# Patient Record
Sex: Male | Born: 1955 | Race: White | Hispanic: No | Marital: Single | Smoking: Never smoker
Health system: Southern US, Community
[De-identification: ages and names within clinical notes are randomized; demographics above are authoritative.]

## PROBLEM LIST (undated history)

## (undated) DIAGNOSIS — I10 Essential (primary) hypertension: Secondary | ICD-10-CM

## (undated) HISTORY — PX: KNEE SURGERY: SHX244

## (undated) HISTORY — PX: FRACTURE SURGERY: SHX138

---

## 2017-11-13 ENCOUNTER — Ambulatory Visit (INDEPENDENT_AMBULATORY_CARE_PROVIDER_SITE_OTHER): Payer: BLUE CROSS/BLUE SHIELD

## 2017-11-13 ENCOUNTER — Encounter: Payer: Self-pay | Admitting: Emergency Medicine

## 2017-11-13 ENCOUNTER — Ambulatory Visit
Admission: EM | Admit: 2017-11-13 | Discharge: 2017-11-13 | Disposition: A | Discharge status: Home / Self Care | Payer: BLUE CROSS/BLUE SHIELD | Attending: Emergency Medicine | Admitting: Emergency Medicine

## 2017-11-13 ENCOUNTER — Other Ambulatory Visit: Payer: Self-pay

## 2017-11-13 DIAGNOSIS — J069 Acute upper respiratory infection, unspecified: Secondary | ICD-10-CM

## 2017-11-13 DIAGNOSIS — I159 Secondary hypertension, unspecified: Secondary | ICD-10-CM

## 2017-11-13 DIAGNOSIS — E7800 Pure hypercholesterolemia, unspecified: Secondary | ICD-10-CM

## 2017-11-13 DIAGNOSIS — Z760 Encounter for issue of repeat prescription: Secondary | ICD-10-CM

## 2017-11-13 HISTORY — DX: Essential (primary) hypertension: I10

## 2017-11-13 MED ORDER — RANITIDINE HCL 150 MG PO TABS: 150.0000 mg | ORAL_TABLET | Freq: Every day | ORAL | 0 refills | Status: AC

## 2017-11-13 MED ORDER — ALBUTEROL SULFATE HFA 108 (90 BASE) MCG/ACT IN AERS: 1.0000 | INHALATION_SPRAY | Freq: Four times a day (QID) | RESPIRATORY_TRACT | 0 refills | Status: AC | PRN

## 2017-11-13 MED ORDER — METOPROLOL TARTRATE 50 MG PO TABS: 50.0000 mg | ORAL_TABLET | Freq: Every day | ORAL | 0 refills | Status: AC

## 2017-11-13 MED ORDER — AEROCHAMBER PLUS MISC: 2 refills | Status: AC

## 2017-11-13 MED ORDER — BENZONATATE 200 MG PO CAPS: 200.0000 mg | ORAL_CAPSULE | Freq: Three times a day (TID) | ORAL | 0 refills | Status: AC | PRN

## 2017-11-13 MED ORDER — DOXYCYCLINE HYCLATE 100 MG PO CAPS: 100.0000 mg | ORAL_CAPSULE | Freq: Two times a day (BID) | ORAL | 0 refills | Status: AC

## 2017-11-13 MED ORDER — ROSUVASTATIN CALCIUM 10 MG PO TABS: 10.0000 mg | ORAL_TABLET | Freq: Every day | ORAL | 0 refills | Status: AC

## 2017-11-13 NOTE — Discharge Instructions (Signed)
Take the medication as written. Start Mucinex to keep the mucous thin.  Restart your Flonase.  You may take 600 mg of motrin with 1 gram of tylenol up to 3-4 times a day as needed for pain. This is an effective combination for pain.  Most sinus infections are viral and do not need antibiotics unless you have a high fever, have had this for 10 days, or you get better and then get sick again.  I would wait another 5 days to fill the antibiotics.  Start them if you start getting worse, if you start having fevers.  Use a NeilMed sinus rinse as often as you want to to reduce nasal congestion. Follow the directions on the box.   Your chest x-ray was negative for pneumonia today. I have also refilled your blood pressure, cholesterol and GERD medications.  Keep a log of your blood pressure.  Measure once a day, preferably at the same time every day.  Go to www.goodrx.com to look up your medications. This will give you a list of where you can find your prescriptions at the most affordable prices. Or you can ask the pharmacist what the cash price is. This is frequently cheaper than going through insurance.

## 2017-11-13 NOTE — ED Triage Notes (Signed)
Patient c/o cough, congestion and runny nose since Thursday.  Patient denies fevers.  

## 2017-11-13 NOTE — ED Provider Notes (Signed)
HPI  SUBJECTIVE:  Curtis Ruiz is a 62 y.o. male who presents with venous nasal congestion, rhinorrhea, postnasal drip, sinus pain or pressure, occasional cough.  Reports body aches, headaches with coughing, shortness of breath, shortness of breath with exertion, sore throat.  States that he is unable to sleep at night secondary to cough.  No facial swelling, fevers.  No wheezing, ear pain.  He tried NyQuil and DayQuil with improvement of symptoms.  Symptoms are worse with going out to the cold air.  States that he was getting better but then got acutely worse again.  He is not sure if he has any sick contacts.  No antibiotics in the past month.  He took an antipyretic, DayQuil, within the past 6-8 hours.  He did not get a flu shot this year.  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  He also mentions that he has run out of his hypertension, cholesterol and GERD medications 2-3 weeks ago.  He has not been checking his blood pressure as he normally does and is not sure what it is running.  No history of asthma, eczema, COPD.  He dips snuff.  No history of diabetes.  PMD:Dr. Audley Hoseozack    Past Medical History:  Diagnosis Date  . Hypertension     Past Surgical History:  Procedure Laterality Date  . FRACTURE SURGERY    . KNEE SURGERY Left     History reviewed. No pertinent family history.  Social History   Tobacco Use  . Smoking status: Never Smoker  . Smokeless tobacco: Current User    Types: Chew  Substance Use Topics  . Alcohol use: Yes  . Drug use: Not on file    No current facility-administered medications for this encounter.   Current Outpatient Medications:  .  aspirin EC 81 MG tablet, Take 81 mg by mouth daily., Disp: , Rfl:  .  cloNIDine (CATAPRES) 0.1 MG tablet, Take 0.1 mg by mouth 2 (two) times daily., Disp: , Rfl:  .  fluticasone (FLONASE) 50 MCG/ACT nasal spray, Place 1 spray into both nostrils daily., Disp: , Rfl:  .  hydrOXYzine (ATARAX/VISTARIL) 25 MG tablet, Take 25  mg by mouth daily., Disp: , Rfl:  .  albuterol (PROVENTIL HFA;VENTOLIN HFA) 108 (90 Base) MCG/ACT inhaler, Inhale 1-2 puffs into the lungs every 6 (six) hours as needed for wheezing or shortness of breath., Disp: 1 Inhaler, Rfl: 0 .  benzonatate (TESSALON) 200 MG capsule, Take 1 capsule (200 mg total) by mouth 3 (three) times daily as needed for cough., Disp: 30 capsule, Rfl: 0 .  doxycycline (VIBRAMYCIN) 100 MG capsule, Take 1 capsule (100 mg total) by mouth 2 (two) times daily for 7 days., Disp: 14 capsule, Rfl: 0 .  metoprolol tartrate (LOPRESSOR) 50 MG tablet, Take 1 tablet (50 mg total) by mouth daily., Disp: 30 tablet, Rfl: 0 .  ranitidine (ZANTAC) 150 MG tablet, Take 1 tablet (150 mg total) by mouth at bedtime., Disp: 30 tablet, Rfl: 0 .  rosuvastatin (CRESTOR) 10 MG tablet, Take 1 tablet (10 mg total) by mouth daily., Disp: 30 tablet, Rfl: 0 .  Spacer/Aero-Holding Chambers (AEROCHAMBER PLUS) inhaler, Use as instructed, Disp: 1 each, Rfl: 2  No Known Allergies   ROS  As noted in HPI.   Physical Exam  BP 127/80 (BP Location: Left Arm)   Pulse 70   Temp 97.9 F (36.6 C) (Oral)   Resp 16   Ht 6' (1.829 m)   Wt 195  lb (88.5 kg)   SpO2 100%   BMI 26.45 kg/m   Constitutional: Well developed, well nourished, no acute distress Eyes:  EOMI, conjunctiva normal bilaterally HENT: Normocephalic, atraumatic,mucus membranes moist mild nasal congestion.  No sinus tenderness.  Normal turbinates.  Positive postnasal drip.  Normal oropharynx. Respiratory: Normal inspiratory effort lungs clear bilaterally, good air movement  cardiovascular: Normal rate regular rhythm, no murmur, rubs, gallops. GI: nondistended skin: No rash, skin intact Musculoskeletal: no deformities Neurologic: Alert & oriented x 3, no focal neuro deficits Psychiatric: Speech and behavior appropriate   ED Course   Medications - No data to display  Orders Placed This Encounter  Procedures  . DG Chest 2 View     Standing Status:   Standing    Number of Occurrences:   1    Order Specific Question:   Reason for Exam (SYMPTOM  OR DIAGNOSIS REQUIRED)    Answer:   r/o PNA    No results found for this or any previous visit (from the past 24 hour(s)). Dg Chest 2 View  Result Date: 11/13/2017 CLINICAL DATA:  Sick for 4-5 days now with productive cough, chills and pain across BILATERAL lower chest area, body aches, shortness of breath at night, former smoker, history of pneumonia bronchitis EXAM: CHEST  2 VIEW COMPARISON:  None FINDINGS: Normal heart size, mediastinal contours, and pulmonary vascularity. Lungs mildly hyperinflated but clear. No acute infiltrate, pleural effusion or pneumothorax. Old healed fracture mid RIGHT clavicle.  No no acute bony findings. IMPRESSION: Mild hyperinflation without infiltrate. Electronically Signed   By: Ulyses Southward M.D.   On: 11/13/2017 15:40    ED Clinical Impression  Upper respiratory tract infection, unspecified type  Secondary hypertension  Hypercholesterolemia  Medication refill   ED Assessment/Plan  Patient was sent in by his work to make sure that he did not have a pneumonia so we will check a chest x-ray.  Presentation most consistent with a URI.  Plan to send home with Mucinex, Tessalon, albuterol inhaler with a spacer, he is to restart Flonase and saline nasal irrigation.  We will send him home with a wait-and-see prescription of doxycycline if the patient is not better in 5 days or starts to have fevers. Will also write a letter saying that the patient is cleared to drive.  We will also refill his medications which include metoprolol ER 50 mg daily, ranitidine 150 mg nightly, rosuvastatin 10 mg daily, as he is not sure when he is getting home.  Reviewed imaging independently.  Mild hyper inflation no infiltrate.  See radiology report for details.  Plan as above.  Follow-up with PMD as needed, to the ER if he gets worse.  Discussed imaging, MDM, plan and  followup with patient. Discussed sn/sx that should prompt return to the ED. patient agrees with plan.   Meds ordered this encounter  Medications  . metoprolol tartrate (LOPRESSOR) 50 MG tablet    Sig: Take 1 tablet (50 mg total) by mouth daily.    Dispense:  30 tablet    Refill:  0  . ranitidine (ZANTAC) 150 MG tablet    Sig: Take 1 tablet (150 mg total) by mouth at bedtime.    Dispense:  30 tablet    Refill:  0  . rosuvastatin (CRESTOR) 10 MG tablet    Sig: Take 1 tablet (10 mg total) by mouth daily.    Dispense:  30 tablet    Refill:  0  . doxycycline (VIBRAMYCIN) 100 MG  capsule    Sig: Take 1 capsule (100 mg total) by mouth 2 (two) times daily for 7 days.    Dispense:  14 capsule    Refill:  0  . albuterol (PROVENTIL HFA;VENTOLIN HFA) 108 (90 Base) MCG/ACT inhaler    Sig: Inhale 1-2 puffs into the lungs every 6 (six) hours as needed for wheezing or shortness of breath.    Dispense:  1 Inhaler    Refill:  0  . benzonatate (TESSALON) 200 MG capsule    Sig: Take 1 capsule (200 mg total) by mouth 3 (three) times daily as needed for cough.    Dispense:  30 capsule    Refill:  0  . Spacer/Aero-Holding Chambers (AEROCHAMBER PLUS) inhaler    Sig: Use as instructed    Dispense:  1 each    Refill:  2    *This clinic note was created using Dragon dictation software. Therefore, there may be occasional mistakes despite careful proofreading.   ?   Domenick Gong, MD 11/13/17 2020

## 2017-11-16 ENCOUNTER — Telehealth: Payer: Self-pay

## 2017-11-16 NOTE — Telephone Encounter (Signed)
Called to follow up with patient since visit here at [facility]. Patient instructed to call back with any questions or concerns. MAH  

## 2019-05-25 IMAGING — CR DG CHEST 2V
2 series · 4 of 4 positions shown · non-contrast
Comparison: None

CLINICAL DATA: Sick for 4-5 days now with productive cough, chills
and pain across BILATERAL lower chest area, body aches, shortness of
breath at night, former smoker, history of pneumonia bronchitis

EXAM:
CHEST  2 VIEW

[Series 1: chest pa · 0.14mm/px · 2 of 2 slices shown]
[im 1/2]
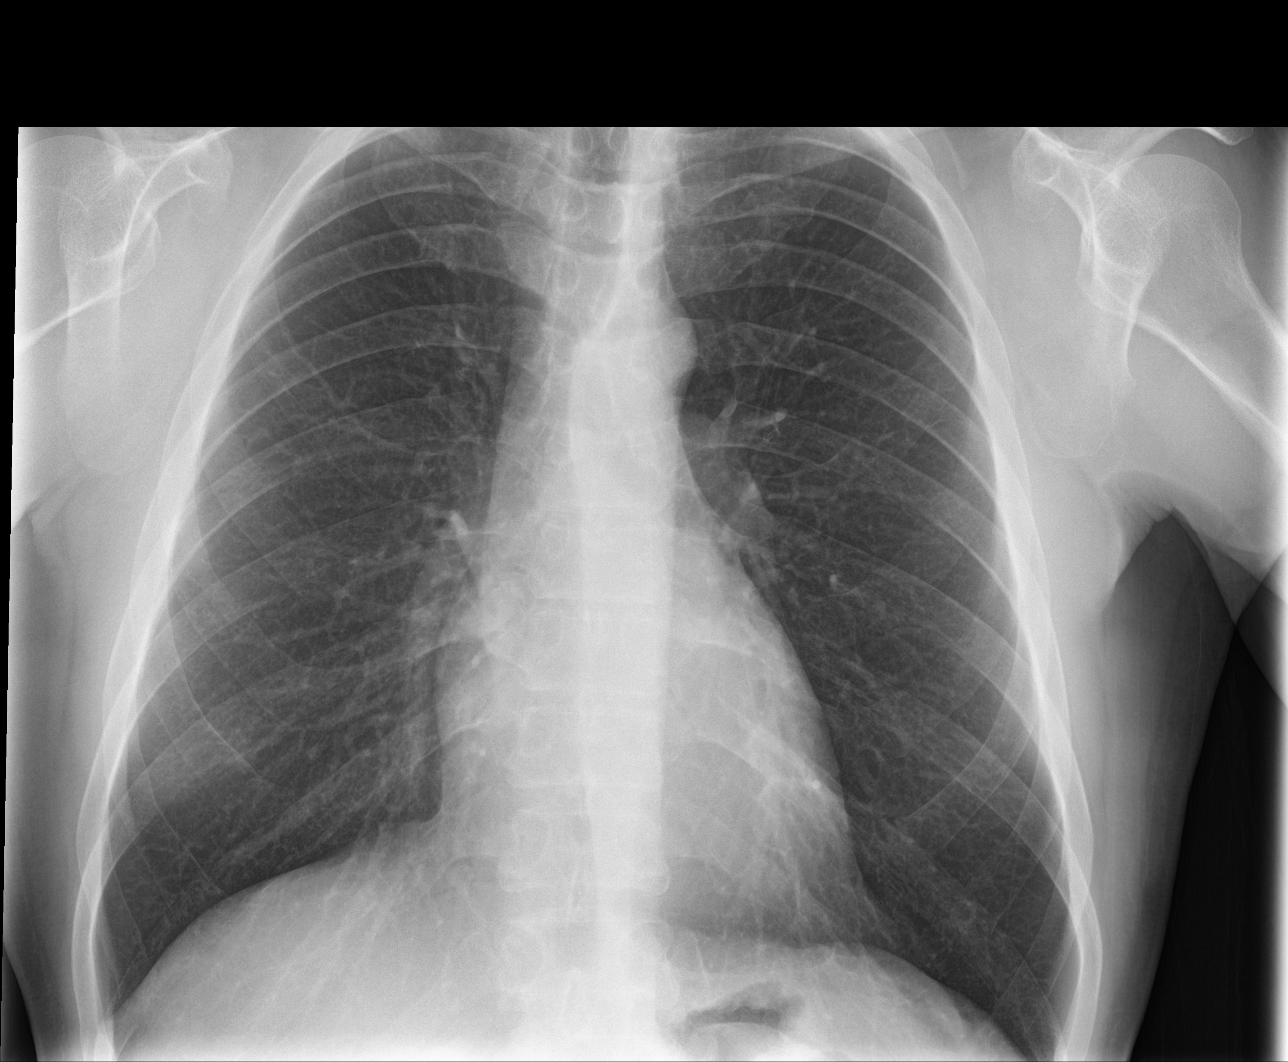
[im 2/2]
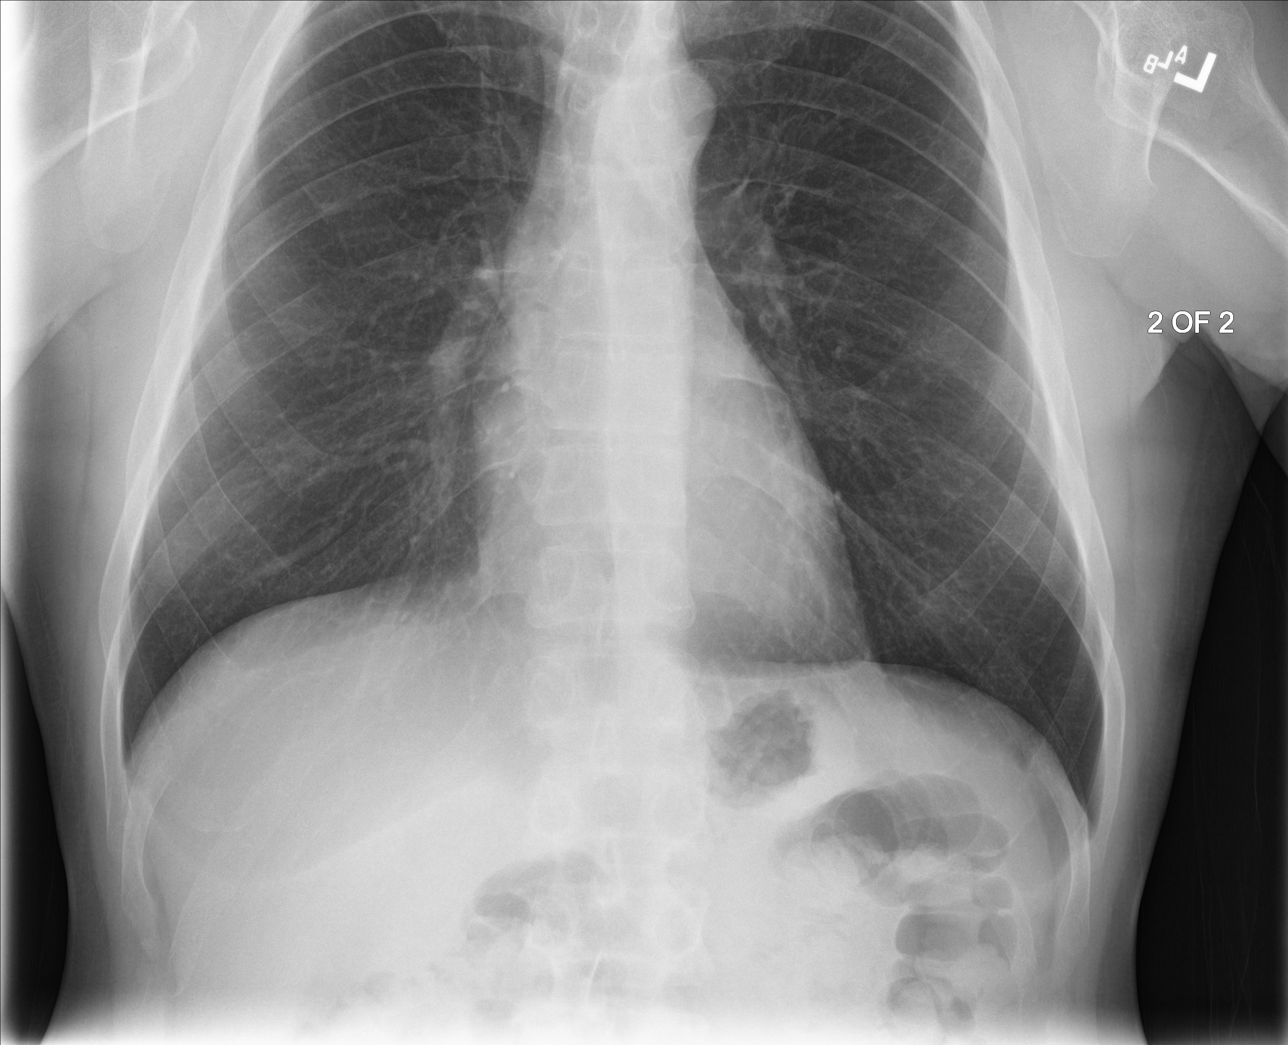

[Series 2: chest lat · 0.14mm/px · 2 of 2 slices shown]
[im 1/2]
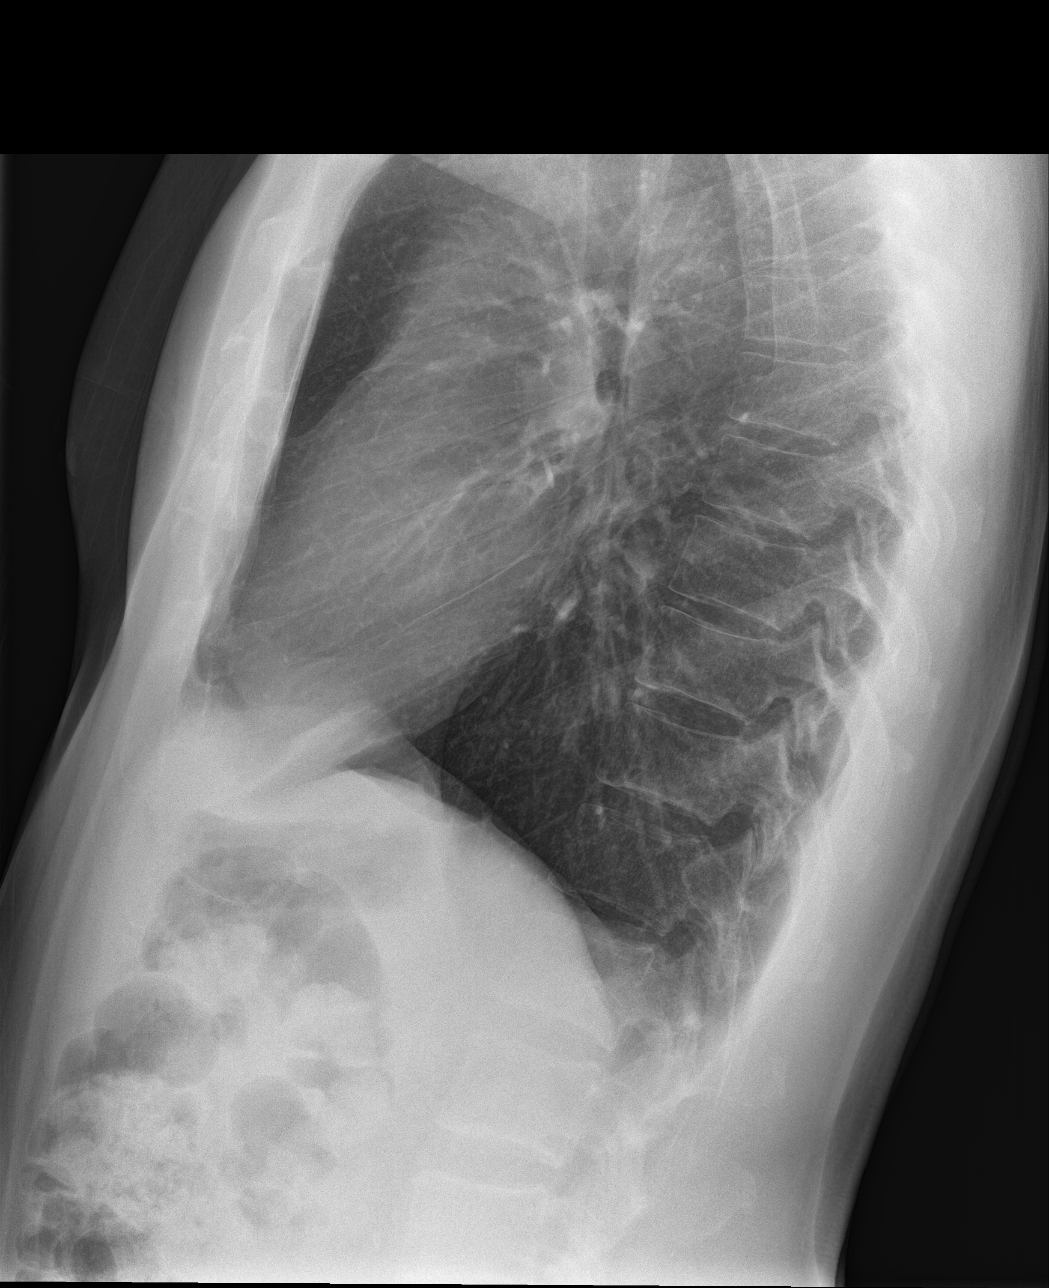
[im 2/2]
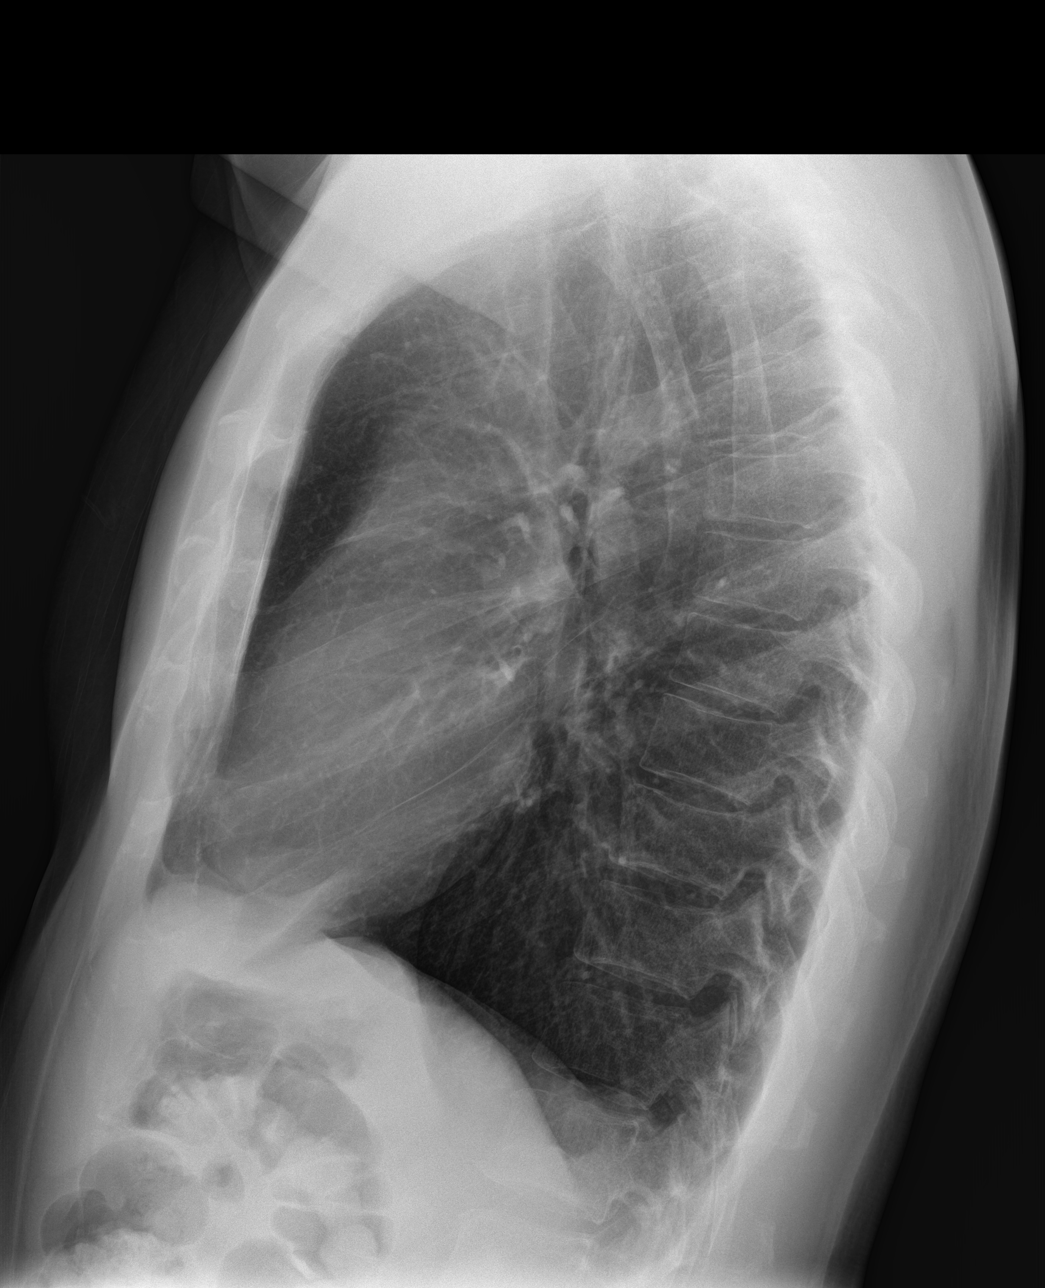

[4 of 4 positions shown; findings below may reference images not displayed]

FINDINGS: Normal heart size, mediastinal contours, and pulmonary vascularity.

Lungs mildly hyperinflated but clear.

No acute infiltrate, pleural effusion or pneumothorax.

Old healed fracture mid RIGHT clavicle.  No no acute bony findings.
IMPRESSION: Mild hyperinflation without infiltrate.
